# Patient Record
Sex: Female | Born: 1995 | Race: Black or African American | Hispanic: No | Marital: Single | Smoking: Never smoker
Health system: Southern US, Community
[De-identification: ages and names within clinical notes are randomized; demographics above are authoritative.]

---

## 2017-10-22 ENCOUNTER — Encounter (HOSPITAL_COMMUNITY): Payer: Self-pay

## 2017-10-22 ENCOUNTER — Other Ambulatory Visit: Payer: Self-pay

## 2017-10-22 ENCOUNTER — Ambulatory Visit (HOSPITAL_COMMUNITY)
Admission: EM | Admit: 2017-10-22 | Discharge: 2017-10-22 | Disposition: A | Discharge status: Home / Self Care | Payer: Medicaid Other | Attending: Family Medicine | Admitting: Family Medicine

## 2017-10-22 DIAGNOSIS — L243 Irritant contact dermatitis due to cosmetics: Secondary | ICD-10-CM

## 2017-10-22 MED ORDER — PREDNISONE 20 MG PO TABS: 40.0000 mg | ORAL_TABLET | Freq: Every day | ORAL | 0 refills | Status: AC

## 2017-10-22 NOTE — ED Triage Notes (Signed)
Pt presents with rash to face after using new body lotion on wednesday

## 2017-10-22 NOTE — Discharge Instructions (Signed)
Use a barrier like Cetaphil at least twice daily over the area. Try not to itch or pick at it.  Things to look out for: increasing pain not relieved by ibuprofen/acetaminophen, fevers, spreading redness, drainage of pus, or foul odor.  Claritin (loratadine), Allegra (fexofenadine), Zyrtec (cetirizine); these are listed in order from weakest to strongest. Generic, and therefore cheaper, options are in the parentheses.   There are available OTC, and the generic versions, which may be cheaper, are in parentheses. Show this to a pharmacist if you have trouble finding any of these items.

## 2017-10-22 NOTE — ED Provider Notes (Signed)
  [facility]    CSN: [id number] Arrival date & time: 10/22/17  1706  Chief Complaint  Patient presents with  . Rash    Linda David is a 22 y.o. female here for an allergic reaction.  Duration: 2 days Any new medications, lotions, soaps, topicals or detergents? Yes, new lotion ACEi/ARB/Estrogen? No Hx of allergic rxn/angioedema/anaphylaxis? No She specifically denies shortness of breath, tongue or lip swelling, or swelling in the throat.  ROS Allergic: As noted in HPI Pulmonary: No SOB  History reviewed. No pertinent past medical history.  Family History  Problem Relation Age of Onset  . Healthy Mother   . Healthy Father     BP 110/79   Pulse 79   Temp 98.3 F (36.8 C)   Resp 18   Wt 200 lb (90.7 kg)   SpO2 99%  General: Well appearing, appearing stated age, well-nourished, awake HEENT: Ears are patent, TM's negative, Nose patent without discharge, MMM, tongue without deviation or edema, uvula without edema, pharynx without erythema or petechiae; Neck without masses, edema or asymmetry Heart: RRR, no murmurs Lungs: CTAB, no rales or stridor, normal respiratory effort without accessory muscle use Neuro: Alert and oriented, fluent and goal-oriented speech Skin: Exposed skin is warm and dry with hyperkeratosis in plaques and patches on cheeks of face; no erythema, drainage, fluctuance, fluid filled lesions Psych: Age appropriate judgment and insight, normal affect and mood  Irritant contact dermatitis due to cosmetics  Pred burst, 40 mg/d for 5 d. This over topical due to burning/irritation. Stop new product. PO antihistamine rec'd.  Cetaphil or barrier recommended to treat similar to a sun burn.  Pt informed to seek emergent care if starting to experience SOB, swelling with tongue or airway/neck.  F/u in 2 weeks if symptoms do not resolve. The patient voiced understanding and agreement to the plan.    Sharlene DoryWendling, Nirvi Boehler Paul, [place] 10/22/17 [phone]

## 2018-04-13 ENCOUNTER — Emergency Department (HOSPITAL_COMMUNITY): Payer: Self-pay

## 2018-04-13 ENCOUNTER — Emergency Department (HOSPITAL_COMMUNITY)
Admission: EM | Admit: 2018-04-13 | Discharge: 2018-04-13 | Disposition: A | Discharge status: Home / Self Care | Payer: Self-pay | Attending: Emergency Medicine | Admitting: Emergency Medicine

## 2018-04-13 ENCOUNTER — Other Ambulatory Visit: Payer: Self-pay

## 2018-04-13 ENCOUNTER — Encounter (HOSPITAL_COMMUNITY): Payer: Self-pay | Admitting: Emergency Medicine

## 2018-04-13 DIAGNOSIS — Y9301 Activity, walking, marching and hiking: Secondary | ICD-10-CM | POA: Insufficient documentation

## 2018-04-13 DIAGNOSIS — X500XXA Overexertion from strenuous movement or load, initial encounter: Secondary | ICD-10-CM | POA: Insufficient documentation

## 2018-04-13 DIAGNOSIS — Y999 Unspecified external cause status: Secondary | ICD-10-CM | POA: Insufficient documentation

## 2018-04-13 DIAGNOSIS — Y929 Unspecified place or not applicable: Secondary | ICD-10-CM | POA: Insufficient documentation

## 2018-04-13 DIAGNOSIS — S92355A Nondisplaced fracture of fifth metatarsal bone, left foot, initial encounter for closed fracture: Secondary | ICD-10-CM | POA: Insufficient documentation

## 2018-04-13 MED ORDER — OXYCODONE-ACETAMINOPHEN 5-325 MG PO TABS: 1.0000 | ORAL_TABLET | Freq: Four times a day (QID) | ORAL | 0 refills | Status: AC | PRN

## 2018-04-13 MED ORDER — ONDANSETRON HCL 4 MG PO TABS: 4.0000 mg | ORAL_TABLET | Freq: Four times a day (QID) | ORAL | 0 refills | Status: AC

## 2018-04-13 MED ORDER — ONDANSETRON 4 MG PO TBDP
4.0000 mg | ORAL_TABLET | Freq: Once | ORAL | Status: AC
  Administered 2018-04-13: 4 mg via ORAL
  Filled 2018-04-13: qty 1

## 2018-04-13 MED ORDER — OXYCODONE-ACETAMINOPHEN 5-325 MG PO TABS
1.0000 | ORAL_TABLET | Freq: Once | ORAL | Status: AC
  Administered 2018-04-13: 1 via ORAL
  Filled 2018-04-13: qty 1

## 2018-04-13 NOTE — ED Notes (Signed)
Per EMS pt fell coming down stairs.

## 2018-04-13 NOTE — ED Provider Notes (Signed)
[facility]-EMERGENCY DEPT Provider Note  CSN: [id number] Arrival date & time: 04/13/18  1500    History   Chief Complaint Chief Complaint  Patient presents with  . Foot Pain    HPI Linda David is a 22 y.o. female with no significant medical history who presented to the ED for left foot pain. Patient states that she was walking down steps on campus when her left foot inverted causing her to fall. Patient reports that she is clumsy and that this occurred on accident. Denies any preceding symptoms such as paresthesias, weakness, foot drop, dizziness/lightheadedness or syncope. Pain is described as severe and aching. She was not ambulatory after the fall and states she is unable to bear weight.  Foot Pain  This is a new problem. The current episode started 1 to 2 hours ago. The problem occurs constantly. The problem has not changed since onset.The symptoms are aggravated by walking and standing. Nothing relieves the symptoms. She has tried nothing for the symptoms.    History reviewed. No pertinent past medical history.  There are no active problems to display for this patient.   History reviewed. No pertinent surgical history.   OB History   None      Home Medications    Prior to Admission medications   Not on File    Family History Family History  Problem Relation Age of Onset  . Healthy Mother   . Healthy Father     Social History Social History   Tobacco Use  . Smoking status: Never Smoker  . Smokeless tobacco: Never Used  Substance Use Topics  . Alcohol use: Never    Frequency: Never  . Drug use: Never     Allergies   Patient has no known allergies.   Review of Systems Review of Systems  Constitutional: Negative for chills and fever.  Musculoskeletal: Positive for arthralgias, gait problem and joint swelling.  Skin: Negative for color change and wound.  Neurological: Negative for weakness and numbness.  Hematological:  Negative.      Physical Exam Updated Vital Signs BP (!) 148/131 (BP Location: Right Arm)   Pulse 87   Temp (!) 97.4 F (36.3 C) (Oral)   Resp (!) 28 Comment: hypervenilating  SpO2 100%   Physical Exam  Constitutional: She appears well-developed and well-nourished.  Crying in pain  Cardiovascular:  Pulses:      Dorsalis pedis pulses are 2+ on the right side, and 2+ on the left side.       Posterior tibial pulses are 2+ on the right side, and 2+ on the left side.  Musculoskeletal:       Left ankle: She exhibits normal range of motion and no swelling. No tenderness. Achilles tendon normal.       Left foot: There is bony tenderness.  Dorsal aspect of left foot is swollen with tenderness along the lateral aspect and 1st MTP. Full ROM of toes, but endorses increased pain with toe flexion. Unable to bear weight or ambulate due to pain. Soft compartments.  Neurological: No sensory deficit. She exhibits normal muscle tone.  Skin: Skin is warm. Capillary refill takes less than 2 seconds. No bruising noted. No erythema.  Nursing note and vitals reviewed.  ED Treatments / Results  Labs (all labs ordered are listed, but only abnormal results are displayed) Labs Reviewed - No data to display  EKG None  Radiology No results found.  Procedures Procedures (including critical care time)  Medications Ordered  in ED Medications  ondansetron (ZOFRAN-ODT) disintegrating tablet 4 mg (4 mg Oral Given 04/13/18 1525)  oxyCODONE-acetaminophen (PERCOCET/ROXICET) 5-325 MG per tablet 1 tablet (1 tablet Oral Given 04/13/18 1525)     Initial Impression / Assessment and Plan / ED Course  Triage vital signs and the nursing notes have been reviewed.  Pertinent labs & imaging results that were available during care of the patient were reviewed and considered in medical decision making (see chart for details).  Patient presents after a mechanical fall where she inverted her foot. She was not ambulatory  after the fall and states that the pain was too severe when she tried to bear weight. On exam, there is swelling on the dorsal aspect of her left foot mainly and tenderness along the lateral portion. Compartments are soft and her distal pulses are intact which is reassuring. Will obtain imaging to further evaluate for bony injury.  Clinical Course as of Apr 13 1741  Wed Apr 13, 2018  1511 Hypertensive and elevated RR upon arrival. Patient seen crying due to pain.   [GM]  1716 5th metatarsal fracture of base. Minimal displacement noted. Will consult ortho on further management and follow-up given that fracture is proximal portion of 5th metatarsal.   [GM]  1740 Case discussed with ortho, Dr. Ave Filter. No surgical intervention required. Patient can follow-up as an outpatient. She will be placed in a splint, given crutches and be instructed on no weight bearing.   [GM]    Clinical Course User Index [GM] Aviel Davalos, Sharyon Medicus, PA-C    Final Clinical Impressions(s) / ED Diagnoses  1. 5th Metatarsal Fracture. Splint applied in ED. Rx for Percocet and Zofran given. No active Rxs in [place] Controlled Substance Database. Advised to ambulate with crutches and be made non-weight bearing. Referral to ortho for follow-up.  Dispo: Home. After thorough clinical evaluation, this patient is determined to be medically stable and can be safely discharged with the previously mentioned treatment and/or outpatient follow-up/referral(s). At this time, there are no other apparent medical conditions that require further screening, evaluation or treatment.   Final diagnoses:  Closed nondisplaced fracture of fifth metatarsal bone of left foot, initial encounter    ED Discharge Orders    None        Reva Bores 04/13/18 1746    Linwood Dibbles, MD 04/13/18 2337

## 2018-04-13 NOTE — ED Notes (Signed)
Ice pack applied to left ankle.

## 2018-04-13 NOTE — Discharge Instructions (Signed)
You have broken the 5th bone of your foot. You will need to follow-up with the orthopedic doctor at [facility], Dr. Susa Simmonds, who is a foot/ankle specialist. His contact information is below. Please call tomorrow to schedule an appointment.  You will need to use crutches to walk until further notice. Do not try to bear weight on your left foot even with it in the splint.  I have sent in pain medication and nausea medication for you to your pharmacy.  I am sorry that this happened to you. Thank you for allowing me to take care of you today! Please be safe.

## 2018-04-13 NOTE — ED Triage Notes (Signed)
Per EMS pt walking up steps; left foot is inverted.

## 2019-07-12 IMAGING — CR DG FOOT COMPLETE 3+V*L*
3 series · 3 of 3 positions shown · non-contrast
Comparison: None.

CLINICAL DATA: Fall on stairs with foot pain, initial encounter

EXAM:
LEFT FOOT - COMPLETE 3+ VIEW

[x foot ap left]
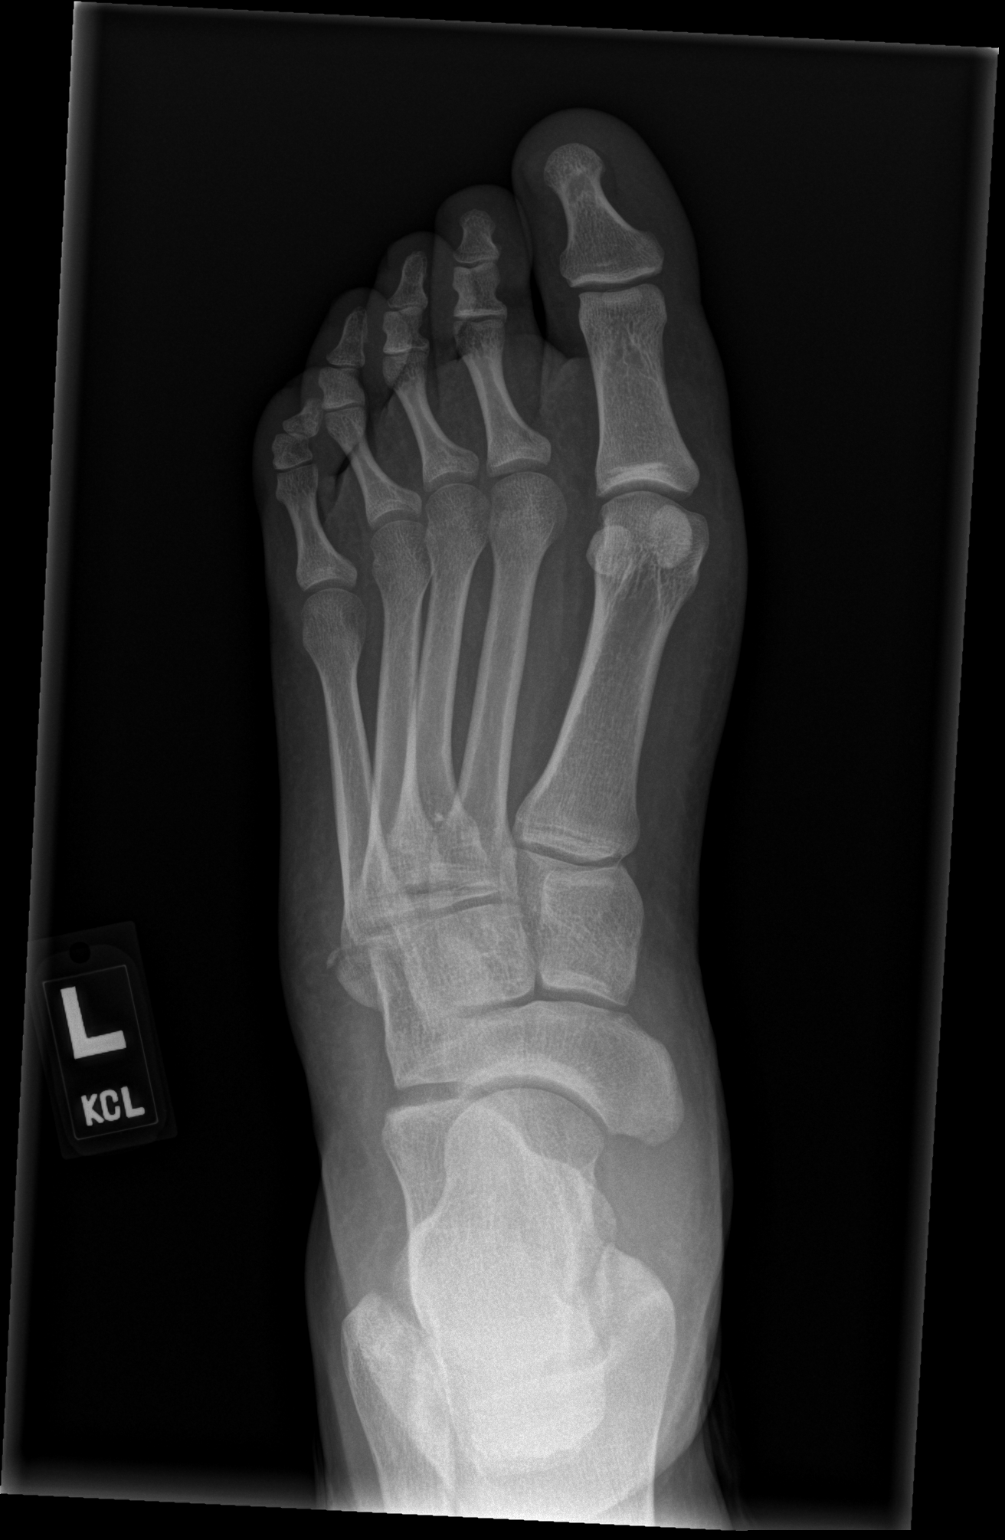

[x foot obl left]
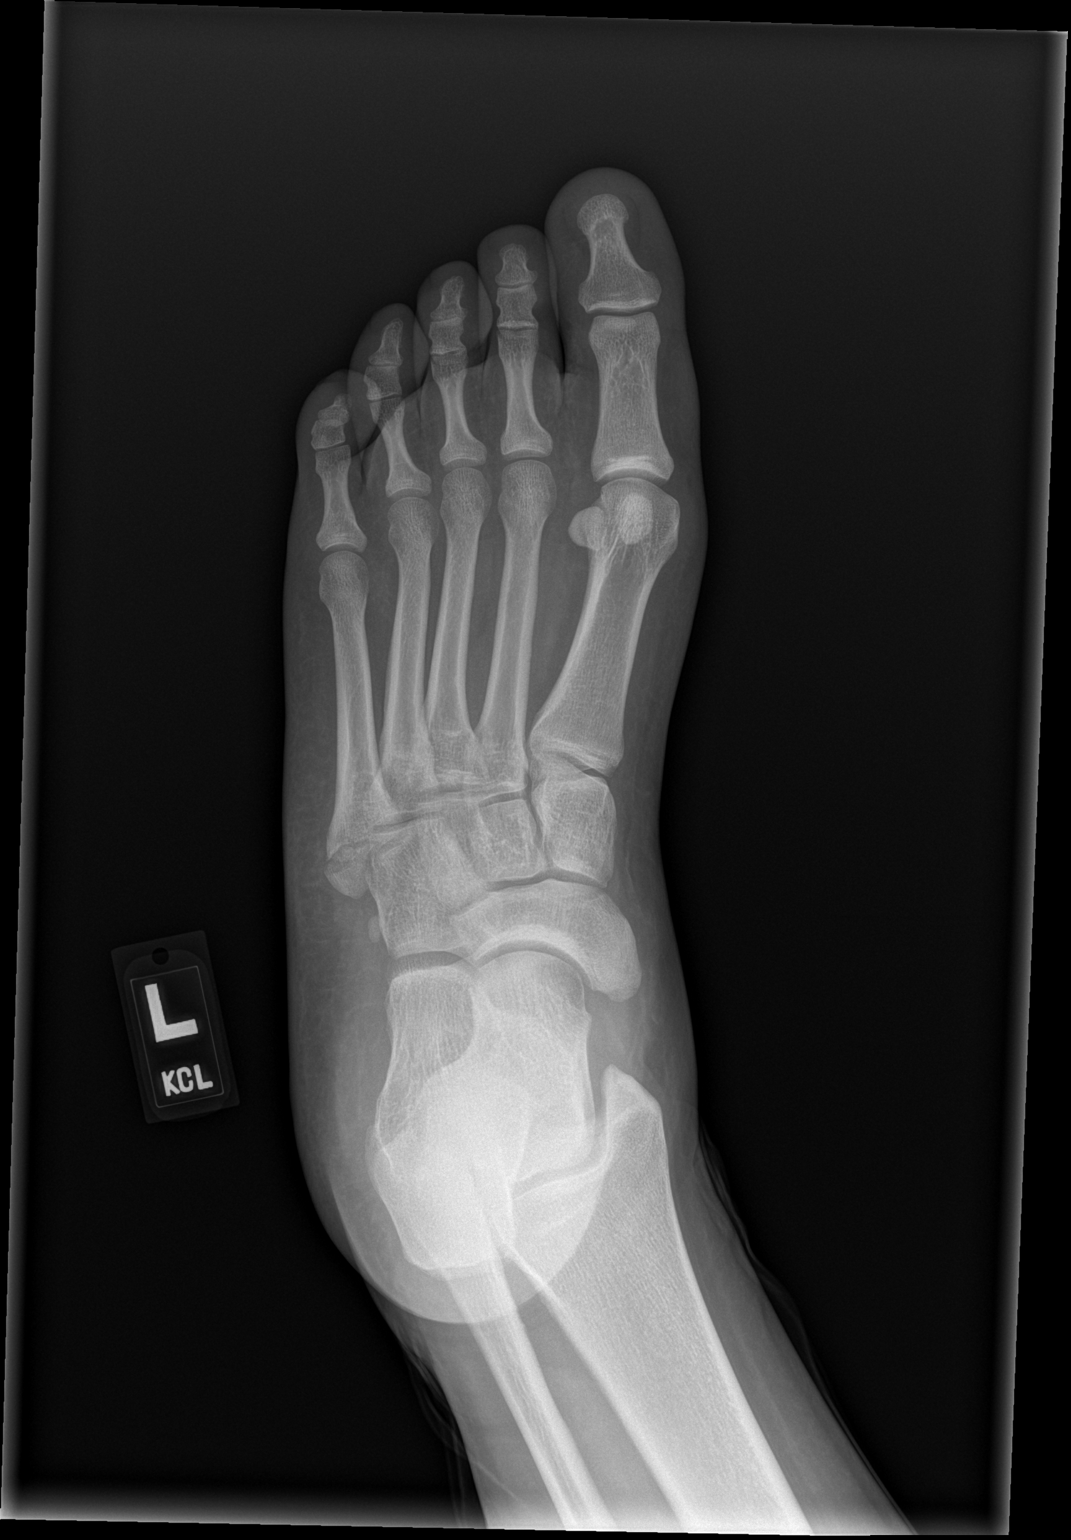

[x foot lat left]
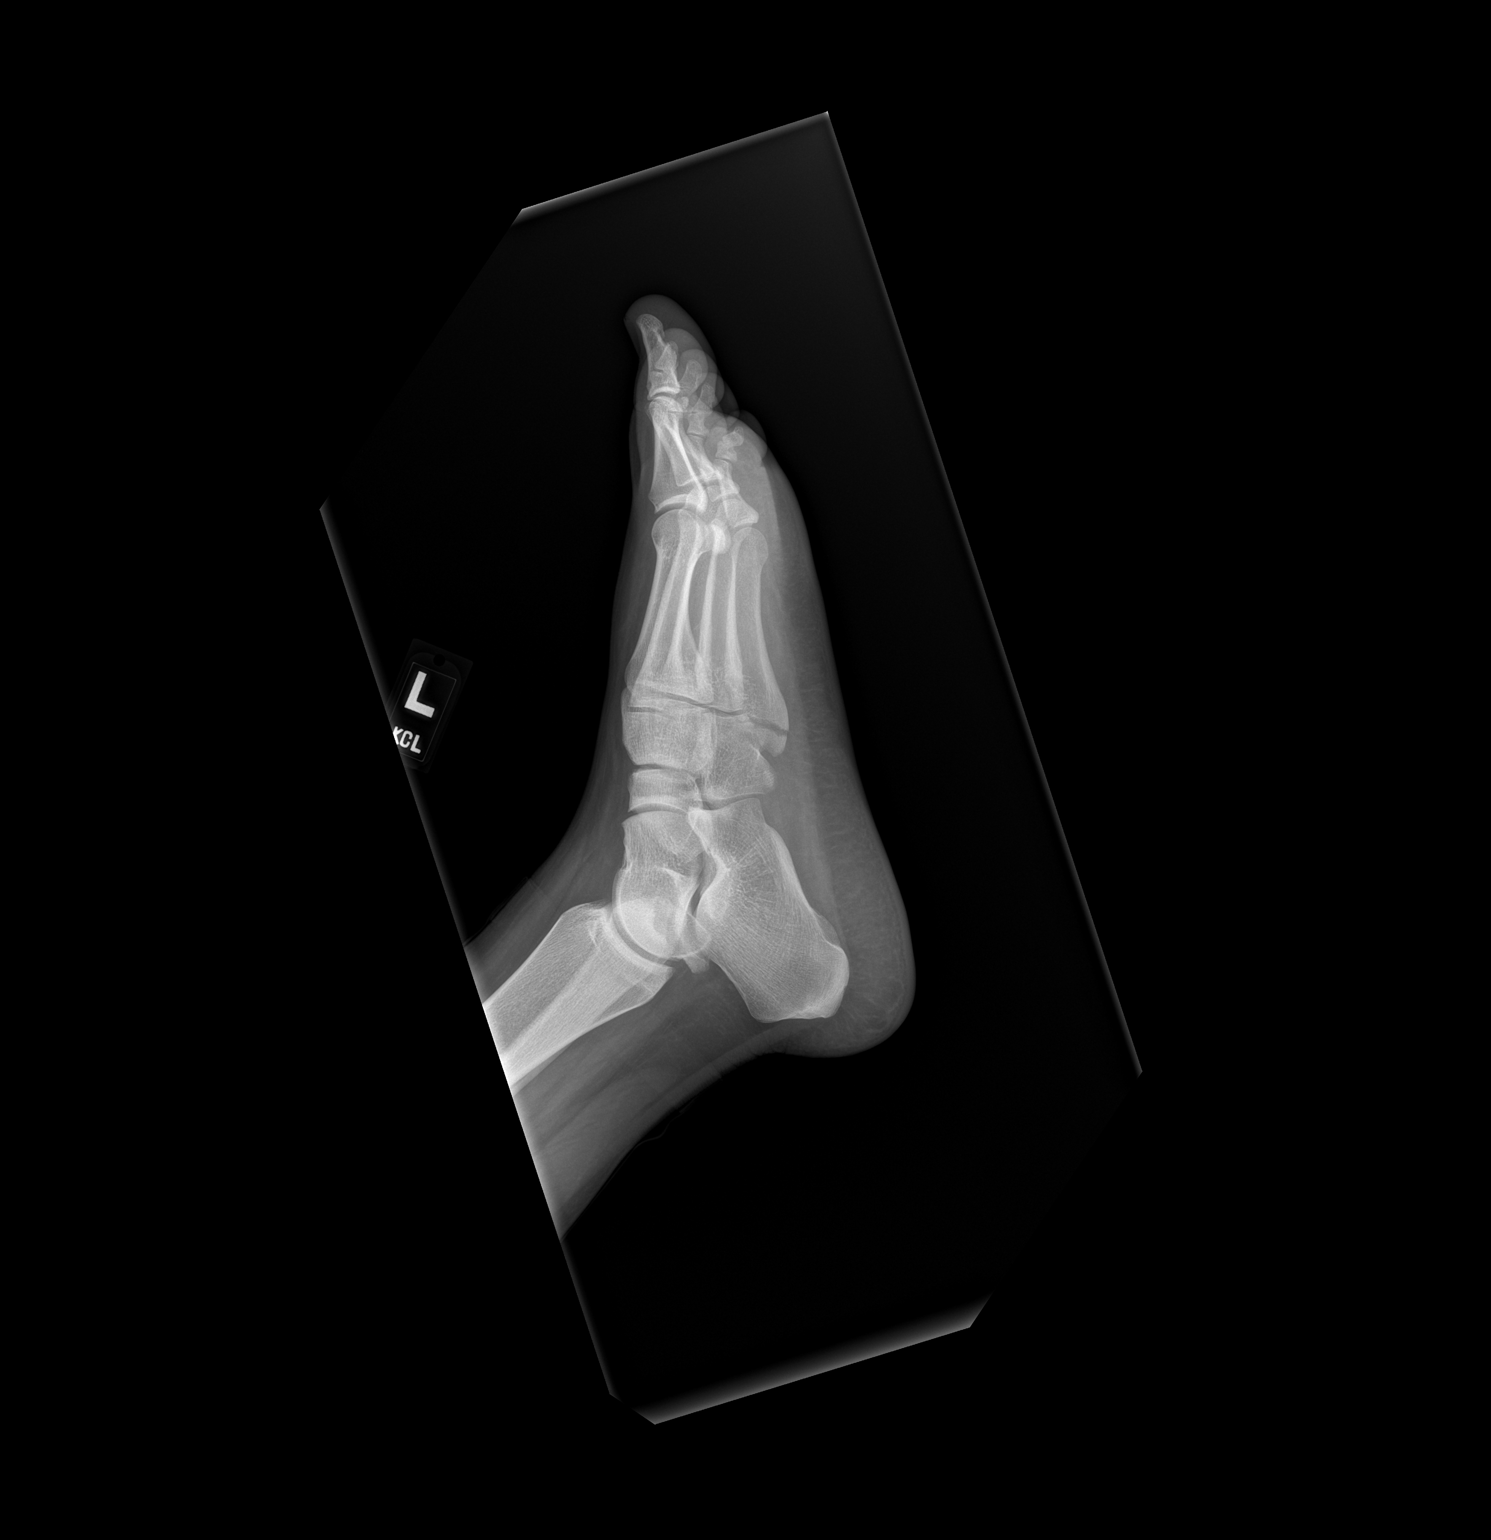

[3 of 3 positions shown; findings below may reference images not displayed]

FINDINGS: There is a transverse fracture through the base of the fifth
metatarsal with only mild displacement identified. No significant
soft tissue abnormality is noted. No other fractures are seen.
IMPRESSION: Fifth metatarsal fracture at the base consistent with the recent
injury.
